# Patient Record
Sex: Male | Born: 1995 | Race: White | Hispanic: No | Marital: Single | Smoking: Never smoker
Health system: Southern US, Community
[De-identification: ages and names within clinical notes are randomized; demographics above are authoritative.]

---

## 2018-12-11 ENCOUNTER — Encounter (HOSPITAL_COMMUNITY): Payer: Self-pay | Admitting: *Deleted

## 2018-12-11 ENCOUNTER — Emergency Department (HOSPITAL_COMMUNITY): Payer: BC Managed Care – PPO

## 2018-12-11 ENCOUNTER — Other Ambulatory Visit: Payer: Self-pay

## 2018-12-11 ENCOUNTER — Emergency Department (HOSPITAL_COMMUNITY)
Admission: EM | Admit: 2018-12-11 | Discharge: 2018-12-11 | Disposition: A | Discharge status: Home / Self Care | Payer: BC Managed Care – PPO | Attending: Emergency Medicine | Admitting: Emergency Medicine

## 2018-12-11 DIAGNOSIS — R079 Chest pain, unspecified: Secondary | ICD-10-CM | POA: Diagnosis present

## 2018-12-11 DIAGNOSIS — R0789 Other chest pain: Secondary | ICD-10-CM | POA: Diagnosis not present

## 2018-12-11 LAB — BASIC METABOLIC PANEL
Anion gap: 9 (ref 5–15)
BUN: 17 mg/dL (ref 6–20)
CO2: 25 mmol/L (ref 22–32)
Calcium: 9.8 mg/dL (ref 8.9–10.3)
Chloride: 104 mmol/L (ref 98–111)
Creatinine, Ser: 1.21 mg/dL (ref 0.61–1.24)
GFR calc Af Amer: >60 mL/min (ref >60)
GFR calc non Af Amer: >60 mL/min (ref >60)
Glucose, Bld: 88 mg/dL (ref 70–99)
Potassium: 4.5 mmol/L (ref 3.5–5.1)
Sodium: 138 mmol/L (ref 135–145)

## 2018-12-11 LAB — CBC
HCT: 48.4 % (ref 39.0–52.0)
Hemoglobin: 16.5 g/dL (ref 13.0–17.0)
MCH: 32.2 pg (ref 26.0–34.0)
MCHC: 34.1 g/dL (ref 30.0–36.0)
MCV: 94.3 fL (ref 80.0–100.0)
Platelets: 176 10*3/uL (ref 150–400)
RBC: 5.13 MIL/uL (ref 4.22–5.81)
RDW: 11.7 % (ref 11.5–15.5)
WBC: 5.2 10*3/uL (ref 4.0–10.5)
nRBC: 0 % (ref 0.0–0.2)

## 2018-12-11 LAB — TROPONIN I (HIGH SENSITIVITY): Troponin I (High Sensitivity): 3 ng/L (ref <18)

## 2018-12-11 MED ORDER — LIDOCAINE VISCOUS HCL 2 % MT SOLN
15.0000 mL | Freq: Once | OROMUCOSAL | Status: AC
  Administered 2018-12-11: 15 mL via ORAL
  Filled 2018-12-11: qty 15

## 2018-12-11 MED ORDER — ALUM & MAG HYDROXIDE-SIMETH 200-200-20 MG/5ML PO SUSP
30.0000 mL | Freq: Once | ORAL | Status: AC
  Administered 2018-12-11: 10:00:00 30 mL via ORAL
  Filled 2018-12-11: qty 30

## 2018-12-11 MED ORDER — OMEPRAZOLE 20 MG PO CPDR: 20.0000 mg | DELAYED_RELEASE_CAPSULE | Freq: Every day | ORAL | 0 refills | Status: DC

## 2018-12-11 NOTE — ED Triage Notes (Signed)
To ED for eval of feeling as if food is stuck in chest area after eating. No vomiting. Able to drink water. No difficulty swallowing over the past few days. Speech is clear and airway is patent.

## 2018-12-11 NOTE — ED Notes (Signed)
Pain improved with GI cocktail

## 2018-12-11 NOTE — ED Provider Notes (Signed)
[facility] EMERGENCY DEPARTMENT Provider Note   CSN: [id number] Arrival date & time: 12/11/18  0800     History   Chief Complaint Chief Complaint  Patient presents with   Chest Pain    HPI David Reilly is a 23 y.o. male.     The history is provided by the patient. No language interpreter was used.  Chest Pain    23 year old male presenting c/o chest pain.  Patient report for the past 3 days he has experiencing pain to his midsternal and epigastric region.  Pain is described as a uncomfortable sensation as if food or gas was stuck in his esophagus.  He notices more after eating meals.  He rates the discomfort as approximately 3 out of 10, and tends to improve when he lays down and increases when he walks or moves around.  He does not complain of any fever chills no lightheadedness, dizziness, shortness of breath, productive cough, URI symptom, exertional chest pain, nausea, vomiting, diarrhea, constipation.  Does not complain of any pleuritic chest pain.  Denies any recent travel, recent surgery, prolonged bedrest, active cancer, prior PE or DVT.  Does report family history of cardiac disease but no premature cardiac death.  He denies alcohol use or tobacco use.  He denies any recent strenuous activities.  He has tried Gas-X at home with minimal relief.     History reviewed. No pertinent past medical history.  There are no active problems to display for this patient.   History reviewed. No pertinent surgical history.      Home Medications    Prior to Admission medications   Not on File    Family History No family history on file.  Social History Social History   Tobacco Use   Smoking status: Not on file  Substance Use Topics   Alcohol use: Not on file   Drug use: Not on file     Allergies   Augmentin [amoxicillin-pot clavulanate]   Review of Systems Review of Systems  Cardiovascular: Positive for chest pain.  All other systems  reviewed and are negative.    Physical Exam Updated Vital Signs BP 136/81 (BP Location: Right Arm)    Pulse (!) 112    Temp 98.1 F (36.7 C) (Oral)    Resp 14    SpO2 100%   Physical Exam Vitals signs and nursing note reviewed.  Constitutional:      General: He is not in acute distress.    Appearance: He is well-developed.  HENT:     Head: Atraumatic.  Eyes:     Conjunctiva/sclera: Conjunctivae normal.  Neck:     Musculoskeletal: Neck supple.  Cardiovascular:     Rate and Rhythm: Normal rate and regular rhythm.     Heart sounds: Normal heart sounds.  Pulmonary:     Effort: Pulmonary effort is normal.     Breath sounds: Normal breath sounds. No decreased breath sounds, wheezing or rhonchi.  Chest:     Chest wall: No tenderness.  Abdominal:     Palpations: Abdomen is soft.     Tenderness: There is no abdominal tenderness.  Musculoskeletal:     Right lower leg: No edema.     Left lower leg: No edema.  Skin:    Capillary Refill: Capillary refill takes less than 2 seconds.     Findings: No rash.  Neurological:     Mental Status: He is alert.  Psychiatric:  Mood and Affect: Mood normal.      ED Treatments / Results  Labs (all labs ordered are listed, but only abnormal results are displayed) Labs Reviewed  BASIC METABOLIC PANEL  CBC  TROPONIN I (HIGH SENSITIVITY)    EKG EKG Interpretation  Date/Time:  Wednesday December 11 2018 08:07:53 EDT Ventricular Rate:  100 PR Interval:  146 QRS Duration: 84 QT Interval:  336 QTC Calculation: 433 R Axis:   43 Text Interpretation:  Normal sinus rhythm Normal ECG No old tracing to compare Confirmed by Dorie Rank [phone]) on 12/11/2018 8:56:51 AM   Radiology Dg Chest Port 1 View  Result Date: 12/11/2018 CLINICAL DATA:  Mid chest discomfort. EXAM: PORTABLE CHEST 1 VIEW COMPARISON:  None. FINDINGS: The heart size and mediastinal contours are within normal limits. Both lungs are clear. The visualized skeletal  structures are unremarkable. IMPRESSION: No active disease. Electronically Signed   By: Zetta Bills M.D.   On: 12/11/2018 09:17    Procedures Procedures (including critical care time)  Medications Ordered in ED Medications  alum & mag hydroxide-simeth (MAALOX/MYLANTA) 200-200-20 MG/5ML suspension 30 mL (30 mLs Oral Given 12/11/18 0937)    And  lidocaine (XYLOCAINE) 2 % viscous mouth solution 15 mL (15 mLs Oral Given 12/11/18 0937)     Initial Impression / Assessment and Plan / ED Course  I have reviewed the triage vital signs and the nursing notes.  Pertinent labs & imaging results that were available during my care of the patient were reviewed by me and considered in my medical decision making (see chart for details).  Clinical Course as of Dec 10 1000  Wed Dec 11, 2018  0941 EKG 12-Lead [AW]  [phone] EKG 12-Lead [AW]    Clinical Course User Index [AW] Burt Ek       BP 121/73    Pulse 69    Temp 98.1 F (36.7 C) (Oral)    Resp 12    Ht 5\' 10"  (1.778 m)    Wt 74.8 kg    SpO2 97%    BMI 23.68 kg/m    Final Clinical Impressions(s) / ED Diagnoses   Final diagnoses:  Atypical chest pain    ED Discharge Orders         Ordered    omeprazole (PRILOSEC) 20 MG capsule  Daily     12/11/18 1017         64:25 AM 23 year old male here with midsternal chest pain.  Pain is atypical of ACS.  Initially his heart rate was fast at 112 however he does not have any significant risk factor for PE and does not complain of any shortness of breath.  Will recheck vital signs.  10:02 AM Heart rate normalized after vital signs rechecked.  Patient received GI cocktail and reports moderate improvement of his symptoms.  10:16 AM Labs are reassuring.  Pt stable for discharge.  Referral to GI given, d/c with PPI.  Return precaution discussed.    Domenic Moras, PA-C 12/11/18 1017    Dorie Rank, MD 12/12/18 [phone]

## 2021-01-02 IMAGING — DX DG CHEST 1V PORT
1 series · 2 of 2 positions shown · non-contrast
Comparison: None.

CLINICAL DATA: Mid chest discomfort.

EXAM:
PORTABLE CHEST 1 VIEW

[Series 1: chest · 0.14mm/px · 2 of 2 slices shown]
[im 1/2]
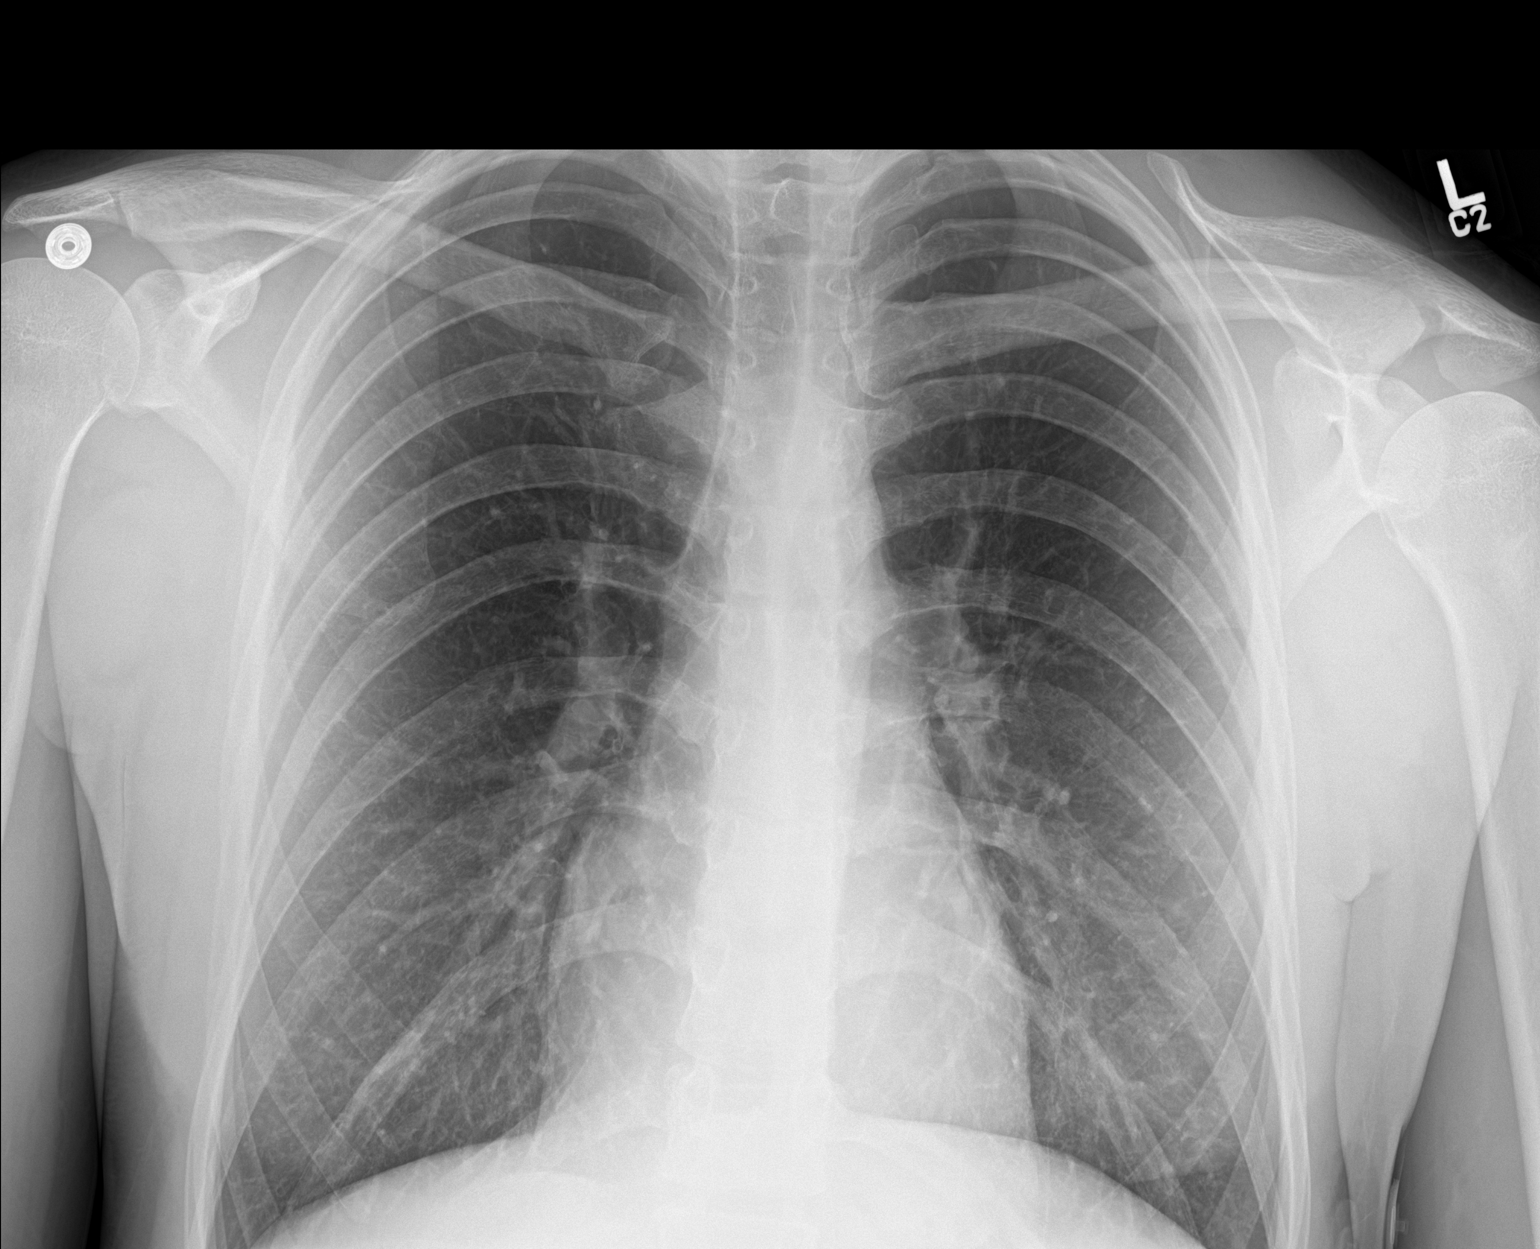
[im 2/2]
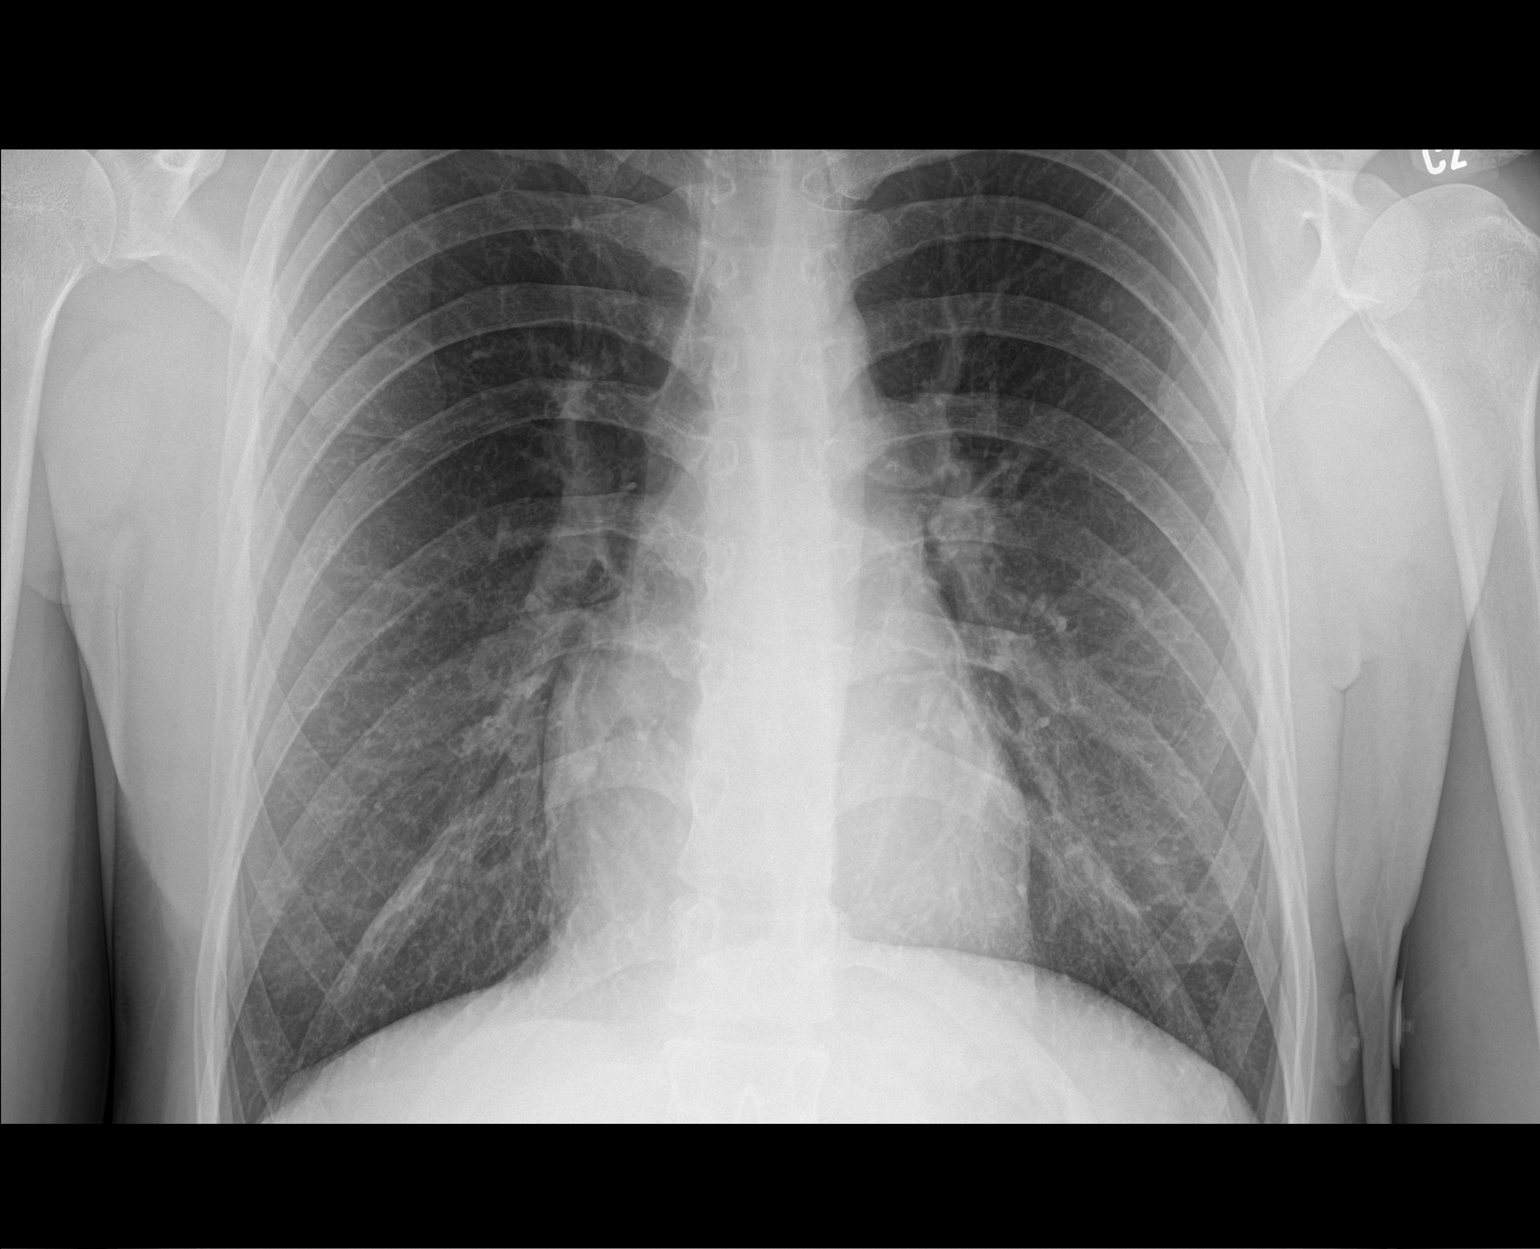

[2 of 2 positions shown; findings below may reference images not displayed]

FINDINGS: The heart size and mediastinal contours are within normal limits.
Both lungs are clear. The visualized skeletal structures are
unremarkable.
IMPRESSION: No active disease.

## 2021-10-24 DIAGNOSIS — Z13220 Encounter for screening for lipoid disorders: Secondary | ICD-10-CM | POA: Diagnosis not present

## 2021-10-24 DIAGNOSIS — Z0000 Encounter for general adult medical examination without abnormal findings: Secondary | ICD-10-CM | POA: Diagnosis not present

## 2021-10-24 DIAGNOSIS — Z1321 Encounter for screening for nutritional disorder: Secondary | ICD-10-CM | POA: Diagnosis not present

## 2021-10-24 DIAGNOSIS — Z1329 Encounter for screening for other suspected endocrine disorder: Secondary | ICD-10-CM | POA: Diagnosis not present

## 2021-10-24 DIAGNOSIS — Z7689 Persons encountering health services in other specified circumstances: Secondary | ICD-10-CM | POA: Diagnosis not present

## 2021-10-24 DIAGNOSIS — Z13228 Encounter for screening for other metabolic disorders: Secondary | ICD-10-CM | POA: Diagnosis not present

## 2021-10-24 DIAGNOSIS — Z130 Encounter for screening for diseases of the blood and blood-forming organs and certain disorders involving the immune mechanism: Secondary | ICD-10-CM | POA: Diagnosis not present

## 2022-06-24 DIAGNOSIS — Z7689 Persons encountering health services in other specified circumstances: Secondary | ICD-10-CM | POA: Diagnosis not present

## 2022-10-13 ENCOUNTER — Ambulatory Visit (INDEPENDENT_AMBULATORY_CARE_PROVIDER_SITE_OTHER): Payer: BC Managed Care – PPO | Admitting: Family Medicine

## 2022-10-13 ENCOUNTER — Encounter: Payer: Self-pay | Admitting: Family Medicine

## 2022-10-13 VITALS — BP 113/74 | HR 68 | Ht 69.0 in | Wt 189.1 lb

## 2022-10-13 DIAGNOSIS — E782 Mixed hyperlipidemia: Secondary | ICD-10-CM

## 2022-10-13 HISTORY — DX: Mixed hyperlipidemia: E78.2

## 2022-10-13 NOTE — Progress Notes (Signed)
New Patient Office Visit  Subjective    Patient ID: David Reilly, male    DOB: 03-25-1995  Age: 27 y.o. MRN: [id number]  CC:  Chief Complaint  Patient presents with   New Patient (Initial Visit)    HPI [name] presents to establish care Moved from [place]. No previous medical issues other than some mild hyperlipidemia.  Lives with wife.  Has 43-week-old child, a girl named Molly No questions or concerns at this time.   PMH: HLD  PSH: wisdom teeth extraction  FH: father - MI  Augmentin allergy  Tobacco use: no Alcohol use: 1 day/week Drug use: no Marital status: married. 6 weeks.  Girl.  [name].   Employment: Airline pilot for [employer]    Outpatient Encounter Medications as of 10/13/2022  Medication Sig   omeprazole (PRILOSEC) 20 MG capsule Take 1 capsule (20 mg total) by mouth daily.   No facility-administered encounter medications on file as of 10/13/2022.    Past Medical History:  Diagnosis Date   Moderate mixed hyperlipidemia not requiring statin therapy 10/13/2022    History reviewed. No pertinent surgical history.  History reviewed. No pertinent family history.  Social History   Socioeconomic History   Marital status: Single    Spouse name: Not on file   Number of children: Not on file   Years of education: Not on file   Highest education level: Not on file  Occupational History   Not on file  Tobacco Use   Smoking status: Never   Smokeless tobacco: Never  Vaping Use   Vaping status: Never Used  Substance and Sexual Activity   Alcohol use: Yes    Alcohol/week: 6.0 standard drinks of alcohol    Types: 6 Cans of beer per week   Drug use: Not Currently   Sexual activity: Yes    Birth control/protection: Condom  Other Topics Concern   Not on file  Social History Narrative   Not on file   Social Determinants of Health   Financial Resource Strain: Not on file  Food Insecurity: Not on file  Transportation Needs: Not on file  Physical  Activity: Not on file  Stress: Not on file  Social Connections: Unknown (08/02/2021)   Received from [facility]   Social Network    Social Network: Not on file  Intimate Partner Violence: Unknown (06/23/2021)   Received from [facility]   HITS    Physically Hurt: Not on file    Insult or Talk Down To: Not on file    Threaten Physical Harm: Not on file    Scream or Curse: Not on file    ROS      Objective    BP 113/74   Pulse 68   Ht 5\' 9"  (1.753 m)   Wt 189 lb 1.9 oz (85.8 kg)   SpO2 97%   BMI 27.93 kg/m   Physical Exam General: Alert and oriented HEENT: PERRLA, EOMI CV: Regular rate rhythm Pulmonary: Lungs clear bilaterally GI: Soft, nontender MSK: Strength equal bilaterally       Assessment & Plan:   Moderate mixed hyperlipidemia not requiring statin therapy Assessment & Plan: Discussed limiting saturated fats in diet, discussed exercise, weight loss as ways to reduce triglycerides. - Follow-up lipid panel - Follow-up A1c  Orders: -     Lipid panel -     Hemoglobin A1c    Return in about 1 year (around 10/13/2023) for physical.   Sandre Kitty, MD

## 2022-10-13 NOTE — Patient Instructions (Signed)
It was nice to see you today,  We addressed the following topics today: - I will follow up with your lab results when we get them.   - your next appointment will be in one year unless you need to see [place] sooner.   Have a great day,  Frederic Jericho, MD

## 2022-10-13 NOTE — Assessment & Plan Note (Signed)
Discussed limiting saturated fats in diet, discussed exercise, weight loss as ways to reduce triglycerides. - Follow-up lipid panel - Follow-up A1c

## 2022-11-14 DIAGNOSIS — M9902 Segmental and somatic dysfunction of thoracic region: Secondary | ICD-10-CM | POA: Diagnosis not present

## 2022-11-14 DIAGNOSIS — M9901 Segmental and somatic dysfunction of cervical region: Secondary | ICD-10-CM | POA: Diagnosis not present

## 2022-11-14 DIAGNOSIS — M9903 Segmental and somatic dysfunction of lumbar region: Secondary | ICD-10-CM | POA: Diagnosis not present

## 2022-11-15 DIAGNOSIS — M9905 Segmental and somatic dysfunction of pelvic region: Secondary | ICD-10-CM | POA: Diagnosis not present

## 2022-11-15 DIAGNOSIS — M9903 Segmental and somatic dysfunction of lumbar region: Secondary | ICD-10-CM | POA: Diagnosis not present

## 2022-11-15 DIAGNOSIS — M9901 Segmental and somatic dysfunction of cervical region: Secondary | ICD-10-CM | POA: Diagnosis not present

## 2022-11-15 DIAGNOSIS — M9906 Segmental and somatic dysfunction of lower extremity: Secondary | ICD-10-CM | POA: Diagnosis not present

## 2022-11-16 DIAGNOSIS — M9901 Segmental and somatic dysfunction of cervical region: Secondary | ICD-10-CM | POA: Diagnosis not present

## 2022-11-16 DIAGNOSIS — M9903 Segmental and somatic dysfunction of lumbar region: Secondary | ICD-10-CM | POA: Diagnosis not present

## 2022-11-16 DIAGNOSIS — M9905 Segmental and somatic dysfunction of pelvic region: Secondary | ICD-10-CM | POA: Diagnosis not present

## 2022-11-16 DIAGNOSIS — M9906 Segmental and somatic dysfunction of lower extremity: Secondary | ICD-10-CM | POA: Diagnosis not present

## 2022-11-21 DIAGNOSIS — M9906 Segmental and somatic dysfunction of lower extremity: Secondary | ICD-10-CM | POA: Diagnosis not present

## 2022-11-21 DIAGNOSIS — M9905 Segmental and somatic dysfunction of pelvic region: Secondary | ICD-10-CM | POA: Diagnosis not present

## 2022-11-21 DIAGNOSIS — M9901 Segmental and somatic dysfunction of cervical region: Secondary | ICD-10-CM | POA: Diagnosis not present

## 2022-11-21 DIAGNOSIS — M9903 Segmental and somatic dysfunction of lumbar region: Secondary | ICD-10-CM | POA: Diagnosis not present

## 2022-11-23 DIAGNOSIS — M9903 Segmental and somatic dysfunction of lumbar region: Secondary | ICD-10-CM | POA: Diagnosis not present

## 2022-11-23 DIAGNOSIS — M9905 Segmental and somatic dysfunction of pelvic region: Secondary | ICD-10-CM | POA: Diagnosis not present

## 2022-11-23 DIAGNOSIS — M9901 Segmental and somatic dysfunction of cervical region: Secondary | ICD-10-CM | POA: Diagnosis not present

## 2022-11-23 DIAGNOSIS — M9906 Segmental and somatic dysfunction of lower extremity: Secondary | ICD-10-CM | POA: Diagnosis not present

## 2022-11-24 DIAGNOSIS — M9903 Segmental and somatic dysfunction of lumbar region: Secondary | ICD-10-CM | POA: Diagnosis not present

## 2022-11-24 DIAGNOSIS — M9905 Segmental and somatic dysfunction of pelvic region: Secondary | ICD-10-CM | POA: Diagnosis not present

## 2022-11-24 DIAGNOSIS — M9901 Segmental and somatic dysfunction of cervical region: Secondary | ICD-10-CM | POA: Diagnosis not present

## 2022-11-24 DIAGNOSIS — M9906 Segmental and somatic dysfunction of lower extremity: Secondary | ICD-10-CM | POA: Diagnosis not present

## 2022-12-06 DIAGNOSIS — M9903 Segmental and somatic dysfunction of lumbar region: Secondary | ICD-10-CM | POA: Diagnosis not present

## 2022-12-06 DIAGNOSIS — M9905 Segmental and somatic dysfunction of pelvic region: Secondary | ICD-10-CM | POA: Diagnosis not present

## 2022-12-06 DIAGNOSIS — M9901 Segmental and somatic dysfunction of cervical region: Secondary | ICD-10-CM | POA: Diagnosis not present

## 2022-12-06 DIAGNOSIS — M9906 Segmental and somatic dysfunction of lower extremity: Secondary | ICD-10-CM | POA: Diagnosis not present

## 2022-12-11 DIAGNOSIS — M9903 Segmental and somatic dysfunction of lumbar region: Secondary | ICD-10-CM | POA: Diagnosis not present

## 2022-12-11 DIAGNOSIS — M9901 Segmental and somatic dysfunction of cervical region: Secondary | ICD-10-CM | POA: Diagnosis not present

## 2022-12-11 DIAGNOSIS — M9906 Segmental and somatic dysfunction of lower extremity: Secondary | ICD-10-CM | POA: Diagnosis not present

## 2022-12-11 DIAGNOSIS — M9905 Segmental and somatic dysfunction of pelvic region: Secondary | ICD-10-CM | POA: Diagnosis not present

## 2022-12-12 DIAGNOSIS — M9906 Segmental and somatic dysfunction of lower extremity: Secondary | ICD-10-CM | POA: Diagnosis not present

## 2022-12-12 DIAGNOSIS — M9905 Segmental and somatic dysfunction of pelvic region: Secondary | ICD-10-CM | POA: Diagnosis not present

## 2022-12-12 DIAGNOSIS — M9903 Segmental and somatic dysfunction of lumbar region: Secondary | ICD-10-CM | POA: Diagnosis not present

## 2022-12-12 DIAGNOSIS — M9901 Segmental and somatic dysfunction of cervical region: Secondary | ICD-10-CM | POA: Diagnosis not present

## 2022-12-14 DIAGNOSIS — M9903 Segmental and somatic dysfunction of lumbar region: Secondary | ICD-10-CM | POA: Diagnosis not present

## 2022-12-14 DIAGNOSIS — M9905 Segmental and somatic dysfunction of pelvic region: Secondary | ICD-10-CM | POA: Diagnosis not present

## 2022-12-14 DIAGNOSIS — M9901 Segmental and somatic dysfunction of cervical region: Secondary | ICD-10-CM | POA: Diagnosis not present

## 2022-12-14 DIAGNOSIS — M9906 Segmental and somatic dysfunction of lower extremity: Secondary | ICD-10-CM | POA: Diagnosis not present

## 2023-01-02 DIAGNOSIS — M9903 Segmental and somatic dysfunction of lumbar region: Secondary | ICD-10-CM | POA: Diagnosis not present

## 2023-01-02 DIAGNOSIS — M9905 Segmental and somatic dysfunction of pelvic region: Secondary | ICD-10-CM | POA: Diagnosis not present

## 2023-01-02 DIAGNOSIS — M9901 Segmental and somatic dysfunction of cervical region: Secondary | ICD-10-CM | POA: Diagnosis not present

## 2023-01-02 DIAGNOSIS — M9906 Segmental and somatic dysfunction of lower extremity: Secondary | ICD-10-CM | POA: Diagnosis not present

## 2023-01-08 DIAGNOSIS — M9905 Segmental and somatic dysfunction of pelvic region: Secondary | ICD-10-CM | POA: Diagnosis not present

## 2023-01-08 DIAGNOSIS — M9906 Segmental and somatic dysfunction of lower extremity: Secondary | ICD-10-CM | POA: Diagnosis not present

## 2023-01-08 DIAGNOSIS — M9903 Segmental and somatic dysfunction of lumbar region: Secondary | ICD-10-CM | POA: Diagnosis not present

## 2023-01-08 DIAGNOSIS — M9901 Segmental and somatic dysfunction of cervical region: Secondary | ICD-10-CM | POA: Diagnosis not present

## 2023-01-09 DIAGNOSIS — M9905 Segmental and somatic dysfunction of pelvic region: Secondary | ICD-10-CM | POA: Diagnosis not present

## 2023-01-09 DIAGNOSIS — M9906 Segmental and somatic dysfunction of lower extremity: Secondary | ICD-10-CM | POA: Diagnosis not present

## 2023-01-09 DIAGNOSIS — M9903 Segmental and somatic dysfunction of lumbar region: Secondary | ICD-10-CM | POA: Diagnosis not present

## 2023-01-09 DIAGNOSIS — M9901 Segmental and somatic dysfunction of cervical region: Secondary | ICD-10-CM | POA: Diagnosis not present

## 2023-01-18 DIAGNOSIS — M9901 Segmental and somatic dysfunction of cervical region: Secondary | ICD-10-CM | POA: Diagnosis not present

## 2023-01-18 DIAGNOSIS — M9906 Segmental and somatic dysfunction of lower extremity: Secondary | ICD-10-CM | POA: Diagnosis not present

## 2023-01-18 DIAGNOSIS — M9905 Segmental and somatic dysfunction of pelvic region: Secondary | ICD-10-CM | POA: Diagnosis not present

## 2023-01-18 DIAGNOSIS — M9903 Segmental and somatic dysfunction of lumbar region: Secondary | ICD-10-CM | POA: Diagnosis not present

## 2023-01-22 DIAGNOSIS — M9905 Segmental and somatic dysfunction of pelvic region: Secondary | ICD-10-CM | POA: Diagnosis not present

## 2023-01-22 DIAGNOSIS — M9901 Segmental and somatic dysfunction of cervical region: Secondary | ICD-10-CM | POA: Diagnosis not present

## 2023-01-22 DIAGNOSIS — M9906 Segmental and somatic dysfunction of lower extremity: Secondary | ICD-10-CM | POA: Diagnosis not present

## 2023-01-22 DIAGNOSIS — M9903 Segmental and somatic dysfunction of lumbar region: Secondary | ICD-10-CM | POA: Diagnosis not present

## 2023-01-30 DIAGNOSIS — M9903 Segmental and somatic dysfunction of lumbar region: Secondary | ICD-10-CM | POA: Diagnosis not present

## 2023-01-30 DIAGNOSIS — M9905 Segmental and somatic dysfunction of pelvic region: Secondary | ICD-10-CM | POA: Diagnosis not present

## 2023-01-30 DIAGNOSIS — M9901 Segmental and somatic dysfunction of cervical region: Secondary | ICD-10-CM | POA: Diagnosis not present

## 2023-01-30 DIAGNOSIS — M9906 Segmental and somatic dysfunction of lower extremity: Secondary | ICD-10-CM | POA: Diagnosis not present

## 2023-02-06 DIAGNOSIS — M9903 Segmental and somatic dysfunction of lumbar region: Secondary | ICD-10-CM | POA: Diagnosis not present

## 2023-02-06 DIAGNOSIS — M9906 Segmental and somatic dysfunction of lower extremity: Secondary | ICD-10-CM | POA: Diagnosis not present

## 2023-02-06 DIAGNOSIS — M9901 Segmental and somatic dysfunction of cervical region: Secondary | ICD-10-CM | POA: Diagnosis not present

## 2023-02-06 DIAGNOSIS — M9905 Segmental and somatic dysfunction of pelvic region: Secondary | ICD-10-CM | POA: Diagnosis not present

## 2023-02-12 DIAGNOSIS — M9905 Segmental and somatic dysfunction of pelvic region: Secondary | ICD-10-CM | POA: Diagnosis not present

## 2023-02-12 DIAGNOSIS — M9903 Segmental and somatic dysfunction of lumbar region: Secondary | ICD-10-CM | POA: Diagnosis not present

## 2023-02-12 DIAGNOSIS — M9906 Segmental and somatic dysfunction of lower extremity: Secondary | ICD-10-CM | POA: Diagnosis not present

## 2023-02-12 DIAGNOSIS — M9901 Segmental and somatic dysfunction of cervical region: Secondary | ICD-10-CM | POA: Diagnosis not present

## 2023-02-22 DIAGNOSIS — M9906 Segmental and somatic dysfunction of lower extremity: Secondary | ICD-10-CM | POA: Diagnosis not present

## 2023-02-22 DIAGNOSIS — M9903 Segmental and somatic dysfunction of lumbar region: Secondary | ICD-10-CM | POA: Diagnosis not present

## 2023-02-22 DIAGNOSIS — M9901 Segmental and somatic dysfunction of cervical region: Secondary | ICD-10-CM | POA: Diagnosis not present

## 2023-02-22 DIAGNOSIS — M9905 Segmental and somatic dysfunction of pelvic region: Secondary | ICD-10-CM | POA: Diagnosis not present

## 2023-02-27 DIAGNOSIS — M9903 Segmental and somatic dysfunction of lumbar region: Secondary | ICD-10-CM | POA: Diagnosis not present

## 2023-02-27 DIAGNOSIS — M9906 Segmental and somatic dysfunction of lower extremity: Secondary | ICD-10-CM | POA: Diagnosis not present

## 2023-02-27 DIAGNOSIS — M9905 Segmental and somatic dysfunction of pelvic region: Secondary | ICD-10-CM | POA: Diagnosis not present

## 2023-02-27 DIAGNOSIS — M9901 Segmental and somatic dysfunction of cervical region: Secondary | ICD-10-CM | POA: Diagnosis not present

## 2023-03-05 ENCOUNTER — Encounter: Payer: Self-pay | Admitting: Dermatology

## 2023-03-05 ENCOUNTER — Ambulatory Visit: Payer: BC Managed Care – PPO | Admitting: Dermatology

## 2023-03-05 DIAGNOSIS — L578 Other skin changes due to chronic exposure to nonionizing radiation: Secondary | ICD-10-CM

## 2023-03-05 DIAGNOSIS — D1801 Hemangioma of skin and subcutaneous tissue: Secondary | ICD-10-CM

## 2023-03-05 DIAGNOSIS — Z808 Family history of malignant neoplasm of other organs or systems: Secondary | ICD-10-CM

## 2023-03-05 DIAGNOSIS — D229 Melanocytic nevi, unspecified: Secondary | ICD-10-CM | POA: Diagnosis not present

## 2023-03-05 DIAGNOSIS — Z1283 Encounter for screening for malignant neoplasm of skin: Secondary | ICD-10-CM

## 2023-03-05 DIAGNOSIS — W908XXA Exposure to other nonionizing radiation, initial encounter: Secondary | ICD-10-CM | POA: Diagnosis not present

## 2023-03-05 DIAGNOSIS — L814 Other melanin hyperpigmentation: Secondary | ICD-10-CM

## 2023-03-05 NOTE — Patient Instructions (Signed)

## 2023-03-05 NOTE — Progress Notes (Deleted)
   New Patient Visit   Subjective  David Reilly is a 27 y.o. male who presents for the following: New Pt - TBSE  Patient states he  has *** located at the {LOCATION ON BODY:21951} that he  would like to have examined. Patient reports the areas have been there for {NUMBER 1-10:22536} {Time; units week/month/year w plurals:19499}. He reports the areas {Actions; are/are not:16769} bothersome.Patient rates irritation {NUMBER 1-10:22536} out of 10. He states that the areas {ACTIONS; HAVE/HAVE NOT:19434} spread. Patient reports he  {HAS HAS XLK:44010} previously been treated for these areas. Patient *** Hx of bx. Patient *** family history of skin cancer(s).  The patient has spots, moles and lesions to be evaluated, some may be new or changing and the patient may have concern these could be cancer.   The following portions of the chart were reviewed this encounter and updated as appropriate: medications, allergies, medical history  Review of Systems:  No other skin or systemic complaints except as noted in HPI or Assessment and Plan.  Objective  Well appearing patient in no apparent distress; mood and affect are within normal limits.  A full examination was performed including scalp, head, eyes, ears, nose, lips, neck, chest, axillae, abdomen, back, buttocks, bilateral upper extremities, bilateral lower extremities, hands, feet, fingers, toes, fingernails, and toenails. All findings within normal limits unless otherwise noted below.     Relevant exam findings are noted in the Assessment and Plan.    Assessment & Plan       No follow-ups on file.    Documentation: I have reviewed the above documentation for accuracy and completeness, and I agree with the above.   I, Briggs Edelen Marcha Solders, CMA, am acting as scribe for [employer], DO.   Langston Reusing, DO

## 2023-03-05 NOTE — Progress Notes (Signed)
   New Patient Visit   Subjective  David Reilly is a 27 y.o. male who presents for the following:  New Pt - Total Body Skin Exam (TBSE)  Patient present today for new patient visit for TBSE.The patient denies he  has spots, moles and lesions to be evaluated, some may be new or changing and the patient may have concern these could be cancer. Patient has previously been treated by a dermatologist.Patient reports he  does not have hx of bx. Patient reports family history of skin cancers. (Maternal grandfather - unsure of type, father - unsure of type) Patient reports throughout his lifetime has had moderate sun exposure. Currently, patient reports if he  has excessive sun exposure, he  does apply sunscreen and/or wears protective coverings at times.  The following portions of the chart were reviewed this encounter and updated as appropriate: medications, allergies, medical history  Review of Systems:  No other skin or systemic complaints except as noted in HPI or Assessment and Plan.  Objective  Well appearing patient in no apparent distress; mood and affect are within normal limits.  A full examination was performed including scalp, head, eyes, ears, nose, lips, neck, chest, axillae, abdomen, back, buttocks, bilateral upper extremities, bilateral lower extremities, hands, feet, fingers, toes, fingernails, and toenails. All findings within normal limits unless otherwise noted below.     Relevant exam findings are noted in the Assessment and Plan.    Assessment & Plan   BENIGN MELANOCYTIC NEVI - Tan-brown and/or pink-flesh-colored symmetric macules and papules - Benign appearing on exam today - Observation - Call clinic for new or changing moles - Recommend daily use of broad spectrum spf 30+ sunscreen to sun-exposed areas.   MILD ACTINIC DAMAGE - Chronic condition, secondary to cumulative UV/sun exposure - diffuse scaly erythematous macules with underlying dyspigmentation - Recommend  daily broad spectrum sunscreen SPF 30+ to sun-exposed areas, reapply every 2 hours as needed.  - Staying in the shade or wearing long sleeves, sun glasses (UVA+UVB protection) and wide brim hats (4-inch brim around the entire circumference of the hat) are also recommended for sun protection.  - Call for new or changing lesions.  SKIN CANCER SCREENING PERFORMED TODAY   No follow-ups on file.   Documentation: I have reviewed the above documentation for accuracy and completeness, and I agree with the above.  Langston Reusing, DO

## 2023-03-06 ENCOUNTER — Encounter: Payer: Self-pay | Admitting: Family Medicine

## 2023-03-07 NOTE — Telephone Encounter (Signed)
ok 

## 2023-03-08 DIAGNOSIS — M9903 Segmental and somatic dysfunction of lumbar region: Secondary | ICD-10-CM | POA: Diagnosis not present

## 2023-03-08 DIAGNOSIS — M9905 Segmental and somatic dysfunction of pelvic region: Secondary | ICD-10-CM | POA: Diagnosis not present

## 2023-03-08 DIAGNOSIS — M9906 Segmental and somatic dysfunction of lower extremity: Secondary | ICD-10-CM | POA: Diagnosis not present

## 2023-03-08 DIAGNOSIS — M9901 Segmental and somatic dysfunction of cervical region: Secondary | ICD-10-CM | POA: Diagnosis not present

## 2023-03-20 DIAGNOSIS — M9901 Segmental and somatic dysfunction of cervical region: Secondary | ICD-10-CM | POA: Diagnosis not present

## 2023-03-20 DIAGNOSIS — M9906 Segmental and somatic dysfunction of lower extremity: Secondary | ICD-10-CM | POA: Diagnosis not present

## 2023-03-20 DIAGNOSIS — M9903 Segmental and somatic dysfunction of lumbar region: Secondary | ICD-10-CM | POA: Diagnosis not present

## 2023-03-20 DIAGNOSIS — M9905 Segmental and somatic dysfunction of pelvic region: Secondary | ICD-10-CM | POA: Diagnosis not present

## 2023-04-19 ENCOUNTER — Encounter: Payer: Self-pay | Admitting: Family Medicine

## 2023-04-19 ENCOUNTER — Ambulatory Visit: Payer: Self-pay | Admitting: Family Medicine

## 2023-04-19 VITALS — BP 103/64 | HR 70 | Ht 69.0 in | Wt 190.5 lb

## 2023-04-19 DIAGNOSIS — K6289 Other specified diseases of anus and rectum: Secondary | ICD-10-CM

## 2023-04-19 DIAGNOSIS — K625 Hemorrhage of anus and rectum: Secondary | ICD-10-CM

## 2023-04-19 MED ORDER — NITROGLYCERIN 0.4 % RE OINT: TOPICAL_OINTMENT | RECTAL | 0 refills | Status: DC

## 2023-04-19 NOTE — Patient Instructions (Addendum)
Continue drinking plenty of water and using the stool softener.  I have sent a topical medication for you to apply twice a day to help with healing.  If this alone does not provide adequate relief, I recommend buying over-the-counter hydrocortisone cream for pain relief.  If symptoms have not resolved in about 4 weeks, please come back for further evaluation.

## 2023-04-19 NOTE — Progress Notes (Signed)
   Acute Office Visit  Subjective:     Patient ID: David Reilly, male    DOB: 29-Aug-1995, 29 y.o.   MRN: [id number]  Chief Complaint  Patient presents with   Rectal Bleeding    HPI Patient is in today for rectal bleeding. Blood in Stool: Patient presents for presents evaluation of blood in stool/ rectal bleeding. Patient has associated symptoms of visible blood: just notes blood on TP. The patient denies abdominal pain, alternating loose stools and constipation, constipation, diarrhea, and loose stools.  The patient has a known history of:  hemorrhoids as a child . There is not a history of rectal injury. Patient has similar episodes of rectal bleeding in the past. He has not tried anything over the counter at home other than a stool softener to ensure that his stool is not causing any straining.  ROS See HPI    Objective:    BP 103/64   Pulse 70   Ht 5\' 9"  (1.753 m)   Wt 190 lb 8 oz (86.4 kg)   SpO2 99%   BMI 28.13 kg/m   Physical Exam Constitutional:      General: He is not in acute distress.    Appearance: Normal appearance.  HENT:     Head: Normocephalic and atraumatic.  Cardiovascular:     Rate and Rhythm: Normal rate and regular rhythm.     Heart sounds: Normal heart sounds. No murmur heard.    No friction rub. No gallop.  Pulmonary:     Effort: Pulmonary effort is normal. No respiratory distress.     Breath sounds: Normal breath sounds. No wheezing, rhonchi or rales.  Abdominal:     General: Abdomen is flat. Bowel sounds are normal. There is no distension.     Palpations: Abdomen is soft. There is no mass.     Tenderness: There is no abdominal tenderness. There is no guarding or rebound.  Skin:    General: Skin is warm and dry.  Neurological:     Mental Status: He is alert and oriented to person, place, and time.  Psychiatric:        Mood and Affect: Mood normal.       Assessment & Plan:  Rectal pain -     Nitroglycerin; Apply 1 inch (375 mg) ointment  intra-anally every 12 hours for anal fissure  Dispense: 30 g; Refill: 0  Rectal bleeding -     Nitroglycerin; Apply 1 inch (375 mg) ointment intra-anally every 12 hours for anal fissure  Dispense: 30 g; Refill: 0  Discussed potential causes of rectal bleeding including hemorrhoids and anal fissure.  Given that pain is present with rectal bleeding, most suspicious for anal fissure versus external hemorrhoids.  We discussed that a definitive diagnosis cannot be made without a digital rectal exam.  Between the options of performing DRE today versus deferring DRE until failing a trial of treatment, patient prefers to initiate a trial of topical nitroglycerin and waiting for DRE.  We discussed that if symptoms do not resolve within 3-4 weeks, he should contact PCP office again and will need DRE to confirm diagnosis.  Patient verbalized understanding and is agreeable to this plan.  Return if symptoms worsen or fail to improve.  Melida Quitter, PA

## 2023-05-03 ENCOUNTER — Ambulatory Visit: Payer: BC Managed Care – PPO | Admitting: Family Medicine

## 2023-05-09 ENCOUNTER — Ambulatory Visit: Payer: BC Managed Care – PPO | Admitting: Family Medicine

## 2023-10-08 ENCOUNTER — Other Ambulatory Visit: Payer: BC Managed Care – PPO

## 2023-10-11 ENCOUNTER — Other Ambulatory Visit: Payer: Self-pay | Admitting: *Deleted

## 2023-10-11 DIAGNOSIS — E663 Overweight: Secondary | ICD-10-CM

## 2023-10-11 DIAGNOSIS — E782 Mixed hyperlipidemia: Secondary | ICD-10-CM

## 2023-10-12 ENCOUNTER — Other Ambulatory Visit

## 2023-10-12 DIAGNOSIS — E663 Overweight: Secondary | ICD-10-CM

## 2023-10-12 DIAGNOSIS — E782 Mixed hyperlipidemia: Secondary | ICD-10-CM

## 2023-10-13 ENCOUNTER — Ambulatory Visit: Payer: Self-pay | Admitting: Family Medicine

## 2023-10-13 LAB — LIPID PANEL
Chol/HDL Ratio: 3.4 ratio (ref 0.0–5.0)
Cholesterol, Total: 187 mg/dL (ref 100–199)
HDL: 55 mg/dL (ref >39)
LDL Chol Calc (NIH): 119 mg/dL — ABNORMAL HIGH (ref 0–99)
Triglycerides: 70 mg/dL (ref 0–149)
VLDL Cholesterol Cal: 13 mg/dL (ref 5–40)

## 2023-10-13 LAB — COMPREHENSIVE METABOLIC PANEL WITH GFR
ALT: 9 IU/L (ref 0–44)
AST: 13 IU/L (ref 0–40)
Albumin: 4.6 g/dL (ref 4.3–5.2)
Alkaline Phosphatase: 60 IU/L (ref 44–121)
BUN/Creatinine Ratio: 14 (ref 9–20)
BUN: 15 mg/dL (ref 6–20)
Bilirubin Total: 0.5 mg/dL (ref 0.0–1.2)
CO2: 22 mmol/L (ref 20–29)
Calcium: 9.3 mg/dL (ref 8.7–10.2)
Chloride: 103 mmol/L (ref 96–106)
Creatinine, Ser: 1.11 mg/dL (ref 0.76–1.27)
Globulin, Total: 1.9 g/dL (ref 1.5–4.5)
Glucose: 89 mg/dL (ref 70–99)
Potassium: 4.4 mmol/L (ref 3.5–5.2)
Sodium: 140 mmol/L (ref 134–144)
Total Protein: 6.5 g/dL (ref 6.0–8.5)
eGFR: 92 mL/min/1.73 (ref >59)

## 2023-10-13 LAB — CBC WITH DIFFERENTIAL/PLATELET
Basophils Absolute: 0 x10E3/uL (ref 0.0–0.2)
Basos: 0 %
EOS (ABSOLUTE): 0.1 x10E3/uL (ref 0.0–0.4)
Eos: 3 %
Hematocrit: 46.1 % (ref 37.5–51.0)
Hemoglobin: 14.9 g/dL (ref 13.0–17.7)
Immature Grans (Abs): 0 x10E3/uL (ref 0.0–0.1)
Immature Granulocytes: 0 %
Lymphocytes Absolute: 1.6 x10E3/uL (ref 0.7–3.1)
Lymphs: 35 %
MCH: 32.2 pg (ref 26.6–33.0)
MCHC: 32.3 g/dL (ref 31.5–35.7)
MCV: 100 fL — ABNORMAL HIGH (ref 79–97)
Monocytes Absolute: 0.4 x10E3/uL (ref 0.1–0.9)
Monocytes: 8 %
Neutrophils Absolute: 2.6 x10E3/uL (ref 1.4–7.0)
Neutrophils: 54 %
Platelets: 167 x10E3/uL (ref 150–450)
RBC: 4.63 x10E6/uL (ref 4.14–5.80)
RDW: 12.6 % (ref 11.6–15.4)
WBC: 4.8 x10E3/uL (ref 3.4–10.8)

## 2023-10-13 LAB — HEMOGLOBIN A1C
Est. average glucose Bld gHb Est-mCnc: 100 mg/dL
Hgb A1c MFr Bld: 5.1 % (ref 4.8–5.6)

## 2023-10-15 ENCOUNTER — Encounter: Payer: BC Managed Care – PPO | Admitting: Family Medicine

## 2023-10-25 ENCOUNTER — Encounter: Admitting: Family Medicine

## 2023-11-15 DIAGNOSIS — F4321 Adjustment disorder with depressed mood: Secondary | ICD-10-CM | POA: Diagnosis not present

## 2023-11-22 DIAGNOSIS — F4321 Adjustment disorder with depressed mood: Secondary | ICD-10-CM | POA: Diagnosis not present

## 2023-11-27 ENCOUNTER — Telehealth: Payer: Self-pay

## 2023-11-27 NOTE — Telephone Encounter (Signed)
 Copied from CRM [id number]. Topic: General - Billing Inquiry >> Nov 27, 2023 11:11 AM David Reilly wrote:  Labs obtain on July 25th is not being covered by insurance because they are coded wrong Pt states the code should be Z for preventative care

## 2023-11-28 NOTE — Telephone Encounter (Signed)
 Is it all of the labs that they refuse to pay for or just certain labs? The diagnosis codes associated with those labs are the appropriate codes.  The cmp and lipid panel were associated with his previously high cholesterol and the A1c was associated with his bmi.  The insurance companies, including [employer], have never had an issue with this before even when used during an annual physical.  In fact, the only time we typically have an issue like this with a insurance company refusing to pay for a lab test is when a lab test is associated with a screening diagnosis like the 'z-codes' he mentioned.    I'm not sure if his insurance company is finding creative new ways to avoid paying for basic medical services or if it's some other issue, but I do not believe we can change it at this point.  If he provides us  with a phone number we can call I can talk to them directly to see what the issue is.

## 2023-11-28 NOTE — Telephone Encounter (Signed)
 LVM for pt to call back to see if he could provide the number to the department that he said did not cover the labs.

## 2023-11-29 ENCOUNTER — Telehealth: Payer: Self-pay

## 2023-11-29 NOTE — Telephone Encounter (Signed)
 Copied from CRM [id number]. Topic: General - Other >> Nov 29, 2023 11:12 AM Dedra B wrote: Reason for CRM: Pt wife returning all for 07/15/2023 regarding diagnosis codes. Pt provided the number [phone] and said she was told the diagnoses need to be billed as z codes.

## 2023-12-03 ENCOUNTER — Telehealth: Payer: Self-pay | Admitting: *Deleted

## 2023-12-03 NOTE — Telephone Encounter (Signed)
 Copied from CRM [id number]. Topic: Appointments - Appointment Info/Confirmation >> Nov 29, 2023  5:33 PM Winona SAUNDERS wrote: Rueben from [employer] calling to verify the pt is a pt at [facility] so they can subment a medical records request. They will call back during business hours to check the status of the fax

## 2023-12-03 NOTE — Telephone Encounter (Signed)
 Copied from CRM #[id number]. Topic: General - Other >> Nov 30, 2023  3:33 PM Everette C wrote: Reason for CRM: Kiara with [facility] [employer] has called to request that the patient's last three office visit notes be faxed to their organization at [phone] and their organization can be reached via phone at [phone]   Case [id number]

## 2023-12-03 NOTE — Telephone Encounter (Signed)
 Tried to contact at the number provided in the note and it is the fax number, if they call back they will need to request records from the record department. Their number is [phone]

## 2023-12-03 NOTE — Telephone Encounter (Signed)
 Copied from CRM #[id number]. Topic: Medical Record Request - Records Request >> Nov 30, 2023 11:33 AM Delon DASEN wrote: Reason for CRM: Rueben with [employer] for life insurance [phone]- they are asking for copy of the last 10 visits for patient

## 2023-12-03 NOTE — Telephone Encounter (Signed)
 Called this group and provided the records dept # so that they can request this information

## 2023-12-06 DIAGNOSIS — F4321 Adjustment disorder with depressed mood: Secondary | ICD-10-CM | POA: Diagnosis not present

## 2023-12-10 DIAGNOSIS — M9903 Segmental and somatic dysfunction of lumbar region: Secondary | ICD-10-CM | POA: Diagnosis not present

## 2023-12-10 DIAGNOSIS — M9901 Segmental and somatic dysfunction of cervical region: Secondary | ICD-10-CM | POA: Diagnosis not present

## 2023-12-10 DIAGNOSIS — M9904 Segmental and somatic dysfunction of sacral region: Secondary | ICD-10-CM | POA: Diagnosis not present

## 2023-12-10 DIAGNOSIS — M9905 Segmental and somatic dysfunction of pelvic region: Secondary | ICD-10-CM | POA: Diagnosis not present

## 2023-12-11 DIAGNOSIS — F4321 Adjustment disorder with depressed mood: Secondary | ICD-10-CM | POA: Diagnosis not present

## 2023-12-13 ENCOUNTER — Encounter: Admitting: Family Medicine

## 2023-12-25 DIAGNOSIS — F4321 Adjustment disorder with depressed mood: Secondary | ICD-10-CM | POA: Diagnosis not present

## 2024-01-07 DIAGNOSIS — M9904 Segmental and somatic dysfunction of sacral region: Secondary | ICD-10-CM | POA: Diagnosis not present

## 2024-01-07 DIAGNOSIS — M9901 Segmental and somatic dysfunction of cervical region: Secondary | ICD-10-CM | POA: Diagnosis not present

## 2024-01-07 DIAGNOSIS — M9903 Segmental and somatic dysfunction of lumbar region: Secondary | ICD-10-CM | POA: Diagnosis not present

## 2024-01-07 DIAGNOSIS — M9905 Segmental and somatic dysfunction of pelvic region: Secondary | ICD-10-CM | POA: Diagnosis not present

## 2024-01-08 ENCOUNTER — Ambulatory Visit (INDEPENDENT_AMBULATORY_CARE_PROVIDER_SITE_OTHER): Admitting: Family Medicine

## 2024-01-08 VITALS — BP 113/71 | HR 60 | Ht 69.0 in | Wt 188.0 lb

## 2024-01-08 DIAGNOSIS — Z0000 Encounter for general adult medical examination without abnormal findings: Secondary | ICD-10-CM

## 2024-01-08 DIAGNOSIS — E782 Mixed hyperlipidemia: Secondary | ICD-10-CM | POA: Diagnosis not present

## 2024-01-08 DIAGNOSIS — Z8249 Family history of ischemic heart disease and other diseases of the circulatory system: Secondary | ICD-10-CM | POA: Diagnosis not present

## 2024-01-08 NOTE — Patient Instructions (Signed)
 It was nice to see you today,  We addressed the following topics today: - Please schedule a lab-only visit to have your blood drawn for the Apolipoprotein B and Lipoprotein(a) tests. I will contact you with the results and to discuss if any further action is needed.  Have a great day,  Rolan Slain, MD

## 2024-01-08 NOTE — Assessment & Plan Note (Signed)
-   Mildly elevated LDL at 119. No other metabolic abnormalities on recent labs. Asymptomatic. No personal history of hypertension or smoking. Strong family history of premature CAD with father having an MI at age 29. Discussion held regarding further risk stratification. - Plan to check Apolipoprotein B (ApoB) and Lipoprotein(a) (Lp(a)) to further assess cardiovascular risk. - Counseled that Lp(a) is a genetic marker that does not typically change with lifestyle modifications and is checked once for risk assessment. - Counseled that ApoB will be followed if discordant with LDL levels. - Will follow up with results and discuss further management if indicated.

## 2024-01-08 NOTE — Progress Notes (Signed)
 Annual physical  Subjective   Patient ID: David Reilly, male    DOB: 1995/12/21  Age: 28 y.o. MRN: [id number]  Chief Complaint  Patient presents with   Annual Exam   HPI Macklen is a 28 y.o. old male here  for annual exam.   Subjective - Annual physical examination. - Question regarding mildly elevated LDL of 119. Inquires about other relevant metrics to monitor.  Medications Takes occasional vitamin packets. No other prescribed or over-the-counter medications or supplements.  PMH, PSH, FH, Social Hx PMHx: Hyperlipidemia (LDL 119). PSH: None reported. FH: Father with myocardial infarction at age 29 (non-smoker). Maternal grandfather with myocardial infarction (history of chain-smoking). Social Hx: - Denies tobacco use. - Alcohol: Reports alcohol use 1-3 times per week, depending on work travel. Primarily beer, sometimes 2 liquor drinks prior to beer. - Diet: No specific diet. Reports periods of caloric restriction for about a month if weight gain exceeds 4 pounds. - Exercise: Reports bicycling 4 times per week and recently resumed weightlifting. - Occupation: Unchanged. - Family: One child, 46 months old.  ROS Cardiovascular: Denies chest pain. Pertinent positive family history of premature CAD. Constitutional: Denies any new concerns. All other systems reviewed and are negative.    The ASCVD Risk score (Arnett DK, et al., 2019) failed to calculate for the following reasons:   The 2019 ASCVD risk score is only valid for ages 71 to 59  Health Maintenance Due  Topic Date Due   HIV Screening  Never done   Hepatitis C Screening  Never done   Hepatitis B Vaccines 19-59 Average Risk (1 of 3 - 19+ 3-dose series) Never done   COVID-19 Vaccine (3 - Pfizer risk series) 09/02/2019   HPV VACCINES (2 - Risk male 3-dose series) 02/11/2020   Influenza Vaccine  10/19/2023      Objective:     BP 113/71   Pulse 60   Ht 5' 9 (1.753 m)   Wt 188 lb (85.3 kg)   SpO2 98%   BMI  27.76 kg/m    Physical Exam Gen: alert, oriented HEENT: perrla, eomi, mmm CV: rrr, no murmur Pulm: lctab. No wheeze or crackles.  GI: soft, nbs.  Nontender to palpation MSK: strength equal b/l. Normal gait Ext: no pedal edema Skin: warm and dry, no rashes Psych: pleasant affect.  Spontaneous speech   No results found for any visits on 01/08/24.      Assessment & Plan:   Physical exam, annual  Moderate mixed hyperlipidemia not requiring statin therapy Assessment & Plan: - Mildly elevated LDL at 119. No other metabolic abnormalities on recent labs. Asymptomatic. No personal history of hypertension or smoking. Strong family history of premature CAD with father having an MI at age 32. Discussion held regarding further risk stratification. - Plan to check Apolipoprotein B (ApoB) and Lipoprotein(a) (Lp(a)) to further assess cardiovascular risk. - Counseled that Lp(a) is a genetic marker that does not typically change with lifestyle modifications and is checked once for risk assessment. - Counseled that ApoB will be followed if discordant with LDL levels. - Will follow up with results and discuss further management if indicated.  Orders: -     Apolipoprotein B; Future -     Lipoprotein A (LPA); Future  Family history of heart attack -     Apolipoprotein B; Future -     Lipoprotein A (LPA); Future     Return in about 1 year (around 01/07/2025) for physical.    Geofrey Silliman K  Chandra, MD

## 2024-01-14 DIAGNOSIS — M9903 Segmental and somatic dysfunction of lumbar region: Secondary | ICD-10-CM | POA: Diagnosis not present

## 2024-01-14 DIAGNOSIS — M9904 Segmental and somatic dysfunction of sacral region: Secondary | ICD-10-CM | POA: Diagnosis not present

## 2024-01-14 DIAGNOSIS — M9901 Segmental and somatic dysfunction of cervical region: Secondary | ICD-10-CM | POA: Diagnosis not present

## 2024-01-14 DIAGNOSIS — M9905 Segmental and somatic dysfunction of pelvic region: Secondary | ICD-10-CM | POA: Diagnosis not present

## 2024-01-15 DIAGNOSIS — F4321 Adjustment disorder with depressed mood: Secondary | ICD-10-CM | POA: Diagnosis not present

## 2024-01-16 ENCOUNTER — Other Ambulatory Visit

## 2024-01-16 DIAGNOSIS — Z8249 Family history of ischemic heart disease and other diseases of the circulatory system: Secondary | ICD-10-CM

## 2024-01-16 DIAGNOSIS — E782 Mixed hyperlipidemia: Secondary | ICD-10-CM

## 2024-01-17 ENCOUNTER — Ambulatory Visit: Payer: Self-pay | Admitting: Family Medicine

## 2024-01-18 LAB — APOLIPOPROTEIN B: Apolipoprotein B: 87 mg/dL (ref <90)

## 2024-01-18 LAB — LIPOPROTEIN A (LPA): Lipoprotein (a): 52 nmol/L (ref <75.0)

## 2024-02-05 DIAGNOSIS — F4321 Adjustment disorder with depressed mood: Secondary | ICD-10-CM | POA: Diagnosis not present

## 2024-02-12 ENCOUNTER — Encounter: Admitting: Family Medicine

## 2024-02-20 DIAGNOSIS — M9905 Segmental and somatic dysfunction of pelvic region: Secondary | ICD-10-CM | POA: Diagnosis not present

## 2024-02-20 DIAGNOSIS — M9904 Segmental and somatic dysfunction of sacral region: Secondary | ICD-10-CM | POA: Diagnosis not present

## 2024-02-20 DIAGNOSIS — M9901 Segmental and somatic dysfunction of cervical region: Secondary | ICD-10-CM | POA: Diagnosis not present

## 2024-02-20 DIAGNOSIS — M9903 Segmental and somatic dysfunction of lumbar region: Secondary | ICD-10-CM | POA: Diagnosis not present

## 2024-02-22 ENCOUNTER — Telehealth: Admitting: Family Medicine

## 2024-02-22 DIAGNOSIS — J111 Influenza due to unidentified influenza virus with other respiratory manifestations: Secondary | ICD-10-CM

## 2024-02-22 MED ORDER — OSELTAMIVIR PHOSPHATE 75 MG PO CAPS: 75.0000 mg | ORAL_CAPSULE | Freq: Two times a day (BID) | ORAL | 0 refills | Status: AC

## 2024-02-22 NOTE — Progress Notes (Signed)
 E visit for Flu like symptoms   We are sorry that you are not feeling well.  Here is how we plan to help! Based on what you have shared with me it looks like you may have a respiratory virus that may be influenza.  Influenza or "the flu" is  an infection caused by a respiratory virus. The flu virus is highly contagious and persons who did not receive their yearly flu vaccination may "catch" the flu from close contact.  We have anti-viral medications to treat the viruses that cause this infection. They are not a "cure" and only shorten the course of the infection. These prescriptions are most effective when they are given within the first 2 days of "flu" symptoms. Antiviral medications are indicated if you have a high risk of complications from the flu. You should  also consider an antiviral medication if you are in close contact with someone who is at risk. These medications can help patients avoid complications from the flu but have side effects that you should know.   Possible side effects from Tamiflu  or oseltamivir  include nausea, vomiting, diarrhea, dizziness, headaches, eye redness, sleep problems or other respiratory symptoms. You should not take Tamiflu  if you have an allergy to oseltamivir  or any to the ingredients in Tamiflu .  Based upon your symptoms and potential risk factors I have prescribed Oseltamivir  (Tamiflu ).  It has been sent to your designated pharmacy.  You will take one 75 mg capsule orally twice a day for the next 5 days.   For nasal congestion, you may use an oral decongestant such as Mucinex D or if you have glaucoma or high blood pressure use plain Mucinex.  Saline nasal spray or nasal drops can help and can safely be used as often as needed for congestion.  If you have a sore or scratchy throat, use a saltwater gargle-  to  teaspoon of salt dissolved in a 4-ounce to 8-ounce glass of warm water.  Gargle the solution for approximately 15-30 seconds and then spit.  It is  important not to swallow the solution.  You can also use throat lozenges/cough drops and Chloraseptic spray to help with throat pain or discomfort.  Warm or cold liquids can also be helpful in relieving throat pain.  For headache, pain or general discomfort, you can use Ibuprofen or Tylenol as directed.   Some authorities believe that zinc sprays or the use of Echinacea may shorten the course of your symptoms. You are to isolate at home until you have been fever-free for at least 24 hours without a fever-reducing medication, and symptoms have been steadily improving for 24 hours.  If you must be around other household members who do not have symptoms, you need to make sure that both you and the family members are masking consistently with a high-quality mask.  If you note any worsening of symptoms despite treatment, please seek an in-person evaluation ASAP. If you note any significant shortness of breath or any chest pain, please seek ED evaluation. Please do not delay care!  ANYONE WHO HAS FLU SYMPTOMS SHOULD: Stay home. The flu is highly contagious and going out or to work exposes others! Be sure to drink plenty of fluids. Water is fine as well as fruit juices, sodas and electrolyte beverages. You may want to stay away from caffeine or alcohol. If you are nauseated, try taking small sips of liquids. How do you know if you are getting enough fluid? Your urine should be a pale  yellow or almost colorless. Get rest. Taking a steamy shower or using a humidifier may help nasal congestion and ease sore throat pain. Using a saline nasal spray works much the same way. Cough drops, hard candies and sore throat lozenges may ease your cough. Line up a caregiver. Have someone check on you regularly.  GET HELP RIGHT AWAY IF: You cannot keep down liquids or your medications. You become short of breath Your fell like you are going to pass out or loose consciousness. Your symptoms persist after you have  completed your treatment plan  MAKE SURE YOU  Understand these instructions. Will watch your condition. Will get help right away if you are not doing well or get worse.  Your e-visit answers were reviewed by a board certified advanced clinical practitioner to complete your personal care plan.  Depending on the condition, your plan could have included both over the counter or prescription medications.  If there is a problem please reply  once you have received a response from your provider.  Your safety is important to us .  If you have drug allergies check your prescription carefully.    You can use MyChart to ask questions about today's visit, request a non-urgent call back, or ask for a work or school excuse for 24 hours related to this e-Visit. If it has been greater than 24 hours you will need to follow up with your provider, or enter a new e-Visit to address those concerns.  You will get an e-mail in the next two days asking about your experience.  I hope that your e-visit has been valuable and will speed your recovery. Thank you for using e-visits.   I have spent 5 minutes in review of e-visit questionnaire, review and updating patient chart, medical decision making and response to patient.   Karmina Zufall, FNP

## 2024-02-24 ENCOUNTER — Encounter: Payer: Self-pay | Admitting: Family Medicine

## 2024-02-26 ENCOUNTER — Other Ambulatory Visit: Payer: Self-pay | Admitting: Family Medicine

## 2024-02-26 DIAGNOSIS — W57XXXS Bitten or stung by nonvenomous insect and other nonvenomous arthropods, sequela: Secondary | ICD-10-CM

## 2024-02-26 DIAGNOSIS — R6889 Other general symptoms and signs: Secondary | ICD-10-CM

## 2024-02-26 DIAGNOSIS — F4321 Adjustment disorder with depressed mood: Secondary | ICD-10-CM | POA: Diagnosis not present

## 2024-02-29 ENCOUNTER — Other Ambulatory Visit: Payer: Self-pay | Admitting: Family Medicine

## 2024-02-29 DIAGNOSIS — W57XXXS Bitten or stung by nonvenomous insect and other nonvenomous arthropods, sequela: Secondary | ICD-10-CM

## 2024-03-04 ENCOUNTER — Ambulatory Visit: Payer: BC Managed Care – PPO | Admitting: Dermatology

## 2024-03-04 ENCOUNTER — Encounter: Payer: Self-pay | Admitting: Dermatology

## 2024-03-04 VITALS — BP 122/81

## 2024-03-04 DIAGNOSIS — L578 Other skin changes due to chronic exposure to nonionizing radiation: Secondary | ICD-10-CM | POA: Diagnosis not present

## 2024-03-04 DIAGNOSIS — L821 Other seborrheic keratosis: Secondary | ICD-10-CM

## 2024-03-04 DIAGNOSIS — W908XXA Exposure to other nonionizing radiation, initial encounter: Secondary | ICD-10-CM | POA: Diagnosis not present

## 2024-03-04 DIAGNOSIS — D229 Melanocytic nevi, unspecified: Secondary | ICD-10-CM

## 2024-03-04 DIAGNOSIS — L814 Other melanin hyperpigmentation: Secondary | ICD-10-CM | POA: Diagnosis not present

## 2024-03-04 DIAGNOSIS — Z1283 Encounter for screening for malignant neoplasm of skin: Secondary | ICD-10-CM | POA: Diagnosis not present

## 2024-03-04 DIAGNOSIS — D1801 Hemangioma of skin and subcutaneous tissue: Secondary | ICD-10-CM

## 2024-03-04 NOTE — Patient Instructions (Signed)

## 2024-03-04 NOTE — Progress Notes (Signed)
° °  Total Body Skin Exam (TBSE) Visit   Subjective  David Reilly is a 28 y.o. male ESTABLISHED PATIENT who presents for the following:  Patient (and/or pt guardian) consented to the use of AI-assisted tools for note generation.  Total Body Skin Exam (TBSE)  Patient was last evaluated for TBSE on 03/05/23 .  Patient does not have spots of concern to be evaluated. He does apply sunscreen and/or wears protective coverings. Denied Hx of Bx. Denied family Hx of skin cancers.   The patient has spots, moles and lesions to be evaluated, some may be new or changing and the patient has concerns that these could be cancer.  The following portions of the chart were reviewed this encounter and updated as appropriate: medications, allergies, medical history  Review of Systems:  No other skin or systemic complaints except as noted in HPI or Assessment and Plan.  Objective  Well appearing patient in no apparent distress; mood and affect are within normal limits.  A full examination was performed including scalp, head, eyes, ears, nose, lips, neck, chest, axillae, abdomen, back, buttocks, bilateral upper extremities, bilateral lower extremities, hands, feet, fingers, toes, fingernails, and toenails. All findings within normal limits unless otherwise noted below.   Relevant physical exam findings are noted in the Assessment and Plan.   Assessment & Plan   LENTIGINES, SEBORRHEIC KERATOSES, HEMANGIOMAS - Benign normal skin lesions - Benign-appearing - Call for any changes  BENIGN MELANOCYTIC NEVI - Tan-brown and/or pink-flesh-colored symmetric macules and papules - Benign appearing on exam today - Observation - Call clinic for new or changing moles - Recommend daily use of broad spectrum spf 30+ sunscreen to sun-exposed areas.   MILD ACTINIC DAMAGE - Chronic condition, secondary to cumulative UV/sun exposure - diffuse scaly erythematous macules with underlying dyspigmentation - Recommend daily  broad spectrum sunscreen SPF 30+ to sun-exposed areas, reapply every 2 hours as needed.  - Staying in the shade or wearing long sleeves, sun glasses (UVA+UVB protection) and wide brim hats (4-inch brim around the entire circumference of the hat) are also recommended for sun protection.  - Call for new or changing lesions.   SKIN CANCER SCREENING PERFORMED TODAY.  No follow-ups on file.   Documentation: I have reviewed the above documentation for accuracy and completeness, and I agree with the above.  I, Shirron Maranda, CMA II, am acting as scribe for:  Delon Lenis, DO

## 2024-03-17 ENCOUNTER — Ambulatory Visit: Payer: Self-pay | Admitting: Family Medicine

## 2024-03-17 LAB — LYME DISEASE SEROLOGY W/REFLEX: Lyme Total Antibody EIA: NEGATIVE

## 2024-03-31 ENCOUNTER — Telehealth: Payer: Self-pay | Admitting: *Deleted

## 2024-03-31 NOTE — Telephone Encounter (Signed)
 LVM to inform pt that we need to reschedule the appt that is scheduled on 01/08/25 with Dr. Chandra due to the provider being out of the office.  Please assist him in rescheduling if he calls back.   Will also send a mychart message.

## 2024-04-08 ENCOUNTER — Telehealth: Payer: Self-pay

## 2024-04-08 NOTE — Telephone Encounter (Signed)
 Called patient David Reilly to contact the office if pt calls please advise about the Flu vaccine

## 2025-01-01 ENCOUNTER — Other Ambulatory Visit

## 2025-01-08 ENCOUNTER — Encounter: Admitting: Family Medicine
